# Patient Record
Sex: Male | Born: 1960 | Race: Black or African American | Hispanic: No | Marital: Single | Smoking: Never smoker
Health system: Southern US, Community
[De-identification: ages and names within clinical notes are randomized; demographics above are authoritative.]

## PROBLEM LIST (undated history)

## (undated) DIAGNOSIS — E119 Type 2 diabetes mellitus without complications: Secondary | ICD-10-CM

## (undated) DIAGNOSIS — B20 Human immunodeficiency virus [HIV] disease: Secondary | ICD-10-CM

## (undated) DIAGNOSIS — I10 Essential (primary) hypertension: Secondary | ICD-10-CM

---

## 1998-02-20 ENCOUNTER — Encounter: Payer: Self-pay | Admitting: Emergency Medicine

## 1998-02-20 ENCOUNTER — Emergency Department (HOSPITAL_COMMUNITY): Admission: EM | Admit: 1998-02-20 | Discharge: 1998-02-20 | Payer: Self-pay | Admitting: Emergency Medicine

## 1998-03-01 ENCOUNTER — Encounter: Admission: RE | Admit: 1998-03-01 | Discharge: 1998-05-30 | Payer: Self-pay | Admitting: Sports Medicine

## 1998-03-14 ENCOUNTER — Emergency Department (HOSPITAL_COMMUNITY): Admission: EM | Admit: 1998-03-14 | Discharge: 1998-03-14 | Payer: Self-pay | Admitting: Emergency Medicine

## 1998-03-21 ENCOUNTER — Encounter: Admission: RE | Admit: 1998-03-21 | Discharge: 1998-06-19 | Payer: Self-pay

## 2001-09-25 ENCOUNTER — Emergency Department (HOSPITAL_COMMUNITY): Admission: EM | Admit: 2001-09-25 | Discharge: 2001-09-25 | Payer: Self-pay | Admitting: Emergency Medicine

## 2005-06-19 ENCOUNTER — Ambulatory Visit: Payer: Self-pay | Admitting: Internal Medicine

## 2005-07-31 ENCOUNTER — Ambulatory Visit: Payer: Self-pay | Admitting: Internal Medicine

## 2005-08-28 ENCOUNTER — Ambulatory Visit: Payer: Self-pay | Admitting: Internal Medicine

## 2005-10-08 ENCOUNTER — Ambulatory Visit: Payer: Self-pay | Admitting: Internal Medicine

## 2006-01-10 ENCOUNTER — Ambulatory Visit: Payer: Self-pay | Admitting: Internal Medicine

## 2006-01-13 ENCOUNTER — Ambulatory Visit: Payer: Self-pay | Admitting: Internal Medicine

## 2006-01-20 ENCOUNTER — Ambulatory Visit: Payer: Self-pay | Admitting: Internal Medicine

## 2006-01-20 ENCOUNTER — Ambulatory Visit: Payer: Self-pay | Admitting: *Deleted

## 2006-01-28 ENCOUNTER — Ambulatory Visit: Payer: Self-pay | Admitting: Family Medicine

## 2006-02-14 ENCOUNTER — Ambulatory Visit (HOSPITAL_COMMUNITY): Admission: RE | Admit: 2006-02-14 | Discharge: 2006-02-14 | Payer: Self-pay | Admitting: Internal Medicine

## 2006-02-14 ENCOUNTER — Ambulatory Visit: Payer: Self-pay | Admitting: Internal Medicine

## 2007-06-04 ENCOUNTER — Ambulatory Visit: Payer: Self-pay | Admitting: Internal Medicine

## 2007-06-15 ENCOUNTER — Ambulatory Visit: Payer: Self-pay | Admitting: Internal Medicine

## 2007-06-15 LAB — CONVERTED CEMR LAB
ALT: 161 units/L — ABNORMAL HIGH (ref 0–53)
AST: 68 units/L — ABNORMAL HIGH (ref 0–37)
Absolute CD4: 207 #/uL — ABNORMAL LOW (ref 381–1469)
Albumin: 4.6 g/dL (ref 3.5–5.2)
Alkaline Phosphatase: 70 units/L (ref 39–117)
BUN: 17 mg/dL (ref 6–23)
Basophils Absolute: 0 10*3/uL (ref 0.0–0.1)
Basophils Relative: 1 % (ref 0–1)
CD4 T Helper %: 13 % — ABNORMAL LOW (ref 32–62)
CO2: 21 meq/L (ref 19–32)
Calcium: 9.4 mg/dL (ref 8.4–10.5)
Chloride: 103 meq/L (ref 96–112)
Creatinine, Ser: 1.04 mg/dL (ref 0.40–1.50)
Eosinophils Absolute: 0 10*3/uL (ref 0.0–0.7)
Eosinophils Relative: 1 % (ref 0–5)
Glucose, Bld: 94 mg/dL (ref 70–99)
HCT: 50.5 % (ref 39.0–52.0)
HIV 1 RNA Quant: <50 copies/mL (ref <50)
HIV-1 RNA Quant, Log: <1.7 (ref <1.70)
Hemoglobin: 16.9 g/dL (ref 13.0–17.0)
Lymphocytes Relative: 43 % (ref 12–46)
Lymphs Abs: 1.6 10*3/uL (ref 0.7–4.0)
MCHC: 33.5 g/dL (ref 30.0–36.0)
MCV: 88.6 fL (ref 78.0–100.0)
Monocytes Absolute: 0.3 10*3/uL (ref 0.1–1.0)
Monocytes Relative: 7 % (ref 3–12)
Neutro Abs: 1.8 10*3/uL (ref 1.7–7.7)
Neutrophils Relative %: 48 % (ref 43–77)
Platelets: 178 10*3/uL (ref 150–400)
Potassium: 4.2 meq/L (ref 3.5–5.3)
RBC: 5.7 M/uL (ref 4.22–5.81)
RDW: 13.9 % (ref 11.5–15.5)
Sodium: 137 meq/L (ref 135–145)
Total Bilirubin: 0.4 mg/dL (ref 0.3–1.2)
Total Lymphocytes %: 43 % (ref 12–46)
Total Protein: 8.3 g/dL (ref 6.0–8.3)
Total lymphocyte count: 1591 cells/mcL (ref 700–3300)
WBC, lymph enumeration: 3.7 10*3/uL — ABNORMAL LOW (ref 4.0–10.5)
WBC: 3.7 10*3/uL — ABNORMAL LOW (ref 4.0–10.5)

## 2007-07-02 ENCOUNTER — Ambulatory Visit: Payer: Self-pay | Admitting: Internal Medicine

## 2007-07-16 ENCOUNTER — Ambulatory Visit: Payer: Self-pay | Admitting: Internal Medicine

## 2007-07-21 ENCOUNTER — Ambulatory Visit: Payer: Self-pay | Admitting: Internal Medicine

## 2007-11-03 ENCOUNTER — Ambulatory Visit: Payer: Self-pay | Admitting: Internal Medicine

## 2007-11-03 LAB — CONVERTED CEMR LAB
ALT: 65 units/L — ABNORMAL HIGH (ref 0–53)
AST: 24 units/L (ref 0–37)
Absolute CD4: 262 #/uL — ABNORMAL LOW (ref 381–1469)
Albumin: 4.4 g/dL (ref 3.5–5.2)
Alkaline Phosphatase: 63 units/L (ref 39–117)
BUN: 21 mg/dL (ref 6–23)
Basophils Absolute: 0 10*3/uL (ref 0.0–0.1)
Basophils Relative: 1 % (ref 0–1)
CD4 T Helper %: 12 % — ABNORMAL LOW (ref 32–62)
CO2: 23 meq/L (ref 19–32)
Calcium: 9 mg/dL (ref 8.4–10.5)
Chlamydia, Swab/Urine, PCR: NEGATIVE
Chloride: 104 meq/L (ref 96–112)
Cholesterol: 254 mg/dL — ABNORMAL HIGH (ref 0–200)
Creatinine, Ser: 1.2 mg/dL (ref 0.40–1.50)
Eosinophils Absolute: 0.1 10*3/uL (ref 0.0–0.7)
Eosinophils Relative: 2 % (ref 0–5)
GC Probe Amp, Urine: NEGATIVE
Glucose, Bld: 94 mg/dL (ref 70–99)
HCT: 47.3 % (ref 39.0–52.0)
HDL: 42 mg/dL (ref >39)
HIV 1 RNA Quant: <50 copies/mL (ref <50)
HIV-1 RNA Quant, Log: <1.7 (ref <1.70)
Hemoglobin: 15.9 g/dL (ref 13.0–17.0)
Lymphocytes Relative: 42 % (ref 12–46)
Lymphs Abs: 2.2 10*3/uL (ref 0.7–4.0)
MCHC: 33.6 g/dL (ref 30.0–36.0)
MCV: 89.4 fL (ref 78.0–100.0)
Monocytes Absolute: 0.5 10*3/uL (ref 0.1–1.0)
Monocytes Relative: 9 % (ref 3–12)
Neutro Abs: 2.4 10*3/uL (ref 1.7–7.7)
Neutrophils Relative %: 46 % (ref 43–77)
Platelets: 129 10*3/uL — ABNORMAL LOW (ref 150–400)
Potassium: 4.1 meq/L (ref 3.5–5.3)
RBC: 5.29 M/uL (ref 4.22–5.81)
RDW: 14.3 % (ref 11.5–15.5)
Sodium: 141 meq/L (ref 135–145)
Total Bilirubin: 0.3 mg/dL (ref 0.3–1.2)
Total CHOL/HDL Ratio: 6
Total Lymphocytes %: 42 % (ref 12–46)
Total Protein: 7.7 g/dL (ref 6.0–8.3)
Total lymphocyte count: 2184 cells/mcL (ref 700–3300)
Triglycerides: 534 mg/dL — ABNORMAL HIGH (ref <150)
WBC, lymph enumeration: 5.2 10*3/uL (ref 4.0–10.5)
WBC: 5.2 10*3/uL (ref 4.0–10.5)

## 2008-02-03 ENCOUNTER — Ambulatory Visit: Payer: Self-pay | Admitting: Internal Medicine

## 2008-02-03 LAB — CONVERTED CEMR LAB
Chlamydia, Swab/Urine, PCR: NEGATIVE
Cholesterol: 202 mg/dL — ABNORMAL HIGH (ref 0–200)
Direct LDL: 119 mg/dL — ABNORMAL HIGH
GC Probe Amp, Urine: NEGATIVE
HCV Ab: NEGATIVE
HDL: 43 mg/dL (ref >39)
Hep A Total Ab: POSITIVE — AB
Hep B Core Total Ab: NEGATIVE
Hep B E Ab: NEGATIVE
Hep B S Ab: NEGATIVE
Hepatitis B Surface Ag: NEGATIVE
LDL Cholesterol: 120 mg/dL — ABNORMAL HIGH (ref 0–99)
Total CHOL/HDL Ratio: 4.7
Triglycerides: 194 mg/dL — ABNORMAL HIGH (ref <150)
VLDL: 39 mg/dL (ref 0–40)

## 2013-08-02 ENCOUNTER — Ambulatory Visit (INDEPENDENT_AMBULATORY_CARE_PROVIDER_SITE_OTHER): Payer: No Typology Code available for payment source | Admitting: Emergency Medicine

## 2013-08-02 VITALS — BP 130/96 | HR 97 | Temp 100.4°F | Resp 20 | Ht 71.5 in | Wt 218.8 lb

## 2013-08-02 DIAGNOSIS — J111 Influenza due to unidentified influenza virus with other respiratory manifestations: Secondary | ICD-10-CM

## 2013-08-02 MED ORDER — PSEUDOEPHEDRINE-GUAIFENESIN ER 60-600 MG PO TB12: 1.0000 | ORAL_TABLET | Freq: Two times a day (BID) | ORAL | Status: AC

## 2013-08-02 MED ORDER — OSELTAMIVIR PHOSPHATE 75 MG PO CAPS: 75.0000 mg | ORAL_CAPSULE | Freq: Two times a day (BID) | ORAL | Status: AC

## 2013-08-02 MED ORDER — PROMETHAZINE-CODEINE 6.25-10 MG/5ML PO SYRP: 5.0000 mL | ORAL_SOLUTION | Freq: Four times a day (QID) | ORAL | Status: AC | PRN

## 2013-08-02 NOTE — Patient Instructions (Signed)
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
## 2020-01-17 ENCOUNTER — Institutional Professional Consult (permissible substitution): Payer: Self-pay | Admitting: Pulmonary Disease

## 2020-01-18 ENCOUNTER — Emergency Department (HOSPITAL_COMMUNITY)
Admission: EM | Admit: 2020-01-18 | Discharge: 2020-01-18 | Disposition: A | Discharge status: Home / Self Care | Payer: BLUE CROSS/BLUE SHIELD | Source: Home / Self Care | Attending: Emergency Medicine | Admitting: Emergency Medicine

## 2020-01-18 ENCOUNTER — Other Ambulatory Visit: Payer: Self-pay

## 2020-01-18 ENCOUNTER — Emergency Department (HOSPITAL_COMMUNITY)
Admission: EM | Admit: 2020-01-18 | Discharge: 2020-01-18 | Disposition: A | Discharge status: Home / Self Care | Payer: BLUE CROSS/BLUE SHIELD | Attending: Emergency Medicine | Admitting: Emergency Medicine

## 2020-01-18 ENCOUNTER — Encounter (HOSPITAL_COMMUNITY): Payer: Self-pay

## 2020-01-18 ENCOUNTER — Emergency Department (HOSPITAL_COMMUNITY): Payer: BLUE CROSS/BLUE SHIELD

## 2020-01-18 DIAGNOSIS — I10 Essential (primary) hypertension: Secondary | ICD-10-CM | POA: Insufficient documentation

## 2020-01-18 DIAGNOSIS — U071 COVID-19: Secondary | ICD-10-CM

## 2020-01-18 DIAGNOSIS — R55 Syncope and collapse: Secondary | ICD-10-CM | POA: Insufficient documentation

## 2020-01-18 DIAGNOSIS — D849 Immunodeficiency, unspecified: Secondary | ICD-10-CM | POA: Diagnosis not present

## 2020-01-18 DIAGNOSIS — R05 Cough: Secondary | ICD-10-CM | POA: Diagnosis present

## 2020-01-18 DIAGNOSIS — Z7982 Long term (current) use of aspirin: Secondary | ICD-10-CM | POA: Insufficient documentation

## 2020-01-18 DIAGNOSIS — E119 Type 2 diabetes mellitus without complications: Secondary | ICD-10-CM | POA: Diagnosis not present

## 2020-01-18 DIAGNOSIS — Z79899 Other long term (current) drug therapy: Secondary | ICD-10-CM | POA: Diagnosis not present

## 2020-01-18 HISTORY — DX: Type 2 diabetes mellitus without complications: E11.9

## 2020-01-18 HISTORY — DX: Human immunodeficiency virus (HIV) disease: B20

## 2020-01-18 HISTORY — DX: Essential (primary) hypertension: I10

## 2020-01-18 LAB — COMPREHENSIVE METABOLIC PANEL
ALT: 54 U/L — ABNORMAL HIGH (ref 0–44)
AST: 41 U/L (ref 15–41)
Albumin: 3.3 g/dL — ABNORMAL LOW (ref 3.5–5.0)
Alkaline Phosphatase: 43 U/L (ref 38–126)
Anion gap: 10 (ref 5–15)
BUN: 14 mg/dL (ref 6–20)
CO2: 20 mmol/L — ABNORMAL LOW (ref 22–32)
Calcium: 7.8 mg/dL — ABNORMAL LOW (ref 8.9–10.3)
Chloride: 110 mmol/L (ref 98–111)
Creatinine, Ser: 0.86 mg/dL (ref 0.61–1.24)
GFR calc Af Amer: >60 mL/min (ref >60)
GFR calc non Af Amer: >60 mL/min (ref >60)
Glucose, Bld: 89 mg/dL (ref 70–99)
Potassium: 4.5 mmol/L (ref 3.5–5.1)
Sodium: 140 mmol/L (ref 135–145)
Total Bilirubin: 0.9 mg/dL (ref 0.3–1.2)
Total Protein: 6.7 g/dL (ref 6.5–8.1)

## 2020-01-18 LAB — C-REACTIVE PROTEIN: CRP: 4.9 mg/dL — ABNORMAL HIGH (ref <1.0)

## 2020-01-18 LAB — CBC WITH DIFFERENTIAL/PLATELET
Abs Immature Granulocytes: 0.04 10*3/uL (ref 0.00–0.07)
Basophils Absolute: 0 10*3/uL (ref 0.0–0.1)
Basophils Relative: 0 %
Eosinophils Absolute: 0.1 10*3/uL (ref 0.0–0.5)
Eosinophils Relative: 1 %
HCT: 42.6 % (ref 39.0–52.0)
Hemoglobin: 13.9 g/dL (ref 13.0–17.0)
Immature Granulocytes: 1 %
Lymphocytes Relative: 16 %
Lymphs Abs: 0.7 10*3/uL (ref 0.7–4.0)
MCH: 28.7 pg (ref 26.0–34.0)
MCHC: 32.6 g/dL (ref 30.0–36.0)
MCV: 88 fL (ref 80.0–100.0)
Monocytes Absolute: 0.6 10*3/uL (ref 0.1–1.0)
Monocytes Relative: 14 %
Neutro Abs: 2.9 10*3/uL (ref 1.7–7.7)
Neutrophils Relative %: 68 %
Platelets: 217 10*3/uL (ref 150–400)
RBC: 4.84 MIL/uL (ref 4.22–5.81)
RDW: 13.1 % (ref 11.5–15.5)
WBC: 4.3 10*3/uL (ref 4.0–10.5)
nRBC: 0 % (ref 0.0–0.2)

## 2020-01-18 LAB — PROCALCITONIN: Procalcitonin: <0.1 ng/mL

## 2020-01-18 LAB — D-DIMER, QUANTITATIVE: D-Dimer, Quant: 3.62 ug/mL-FEU — ABNORMAL HIGH (ref 0.00–0.50)

## 2020-01-18 LAB — FERRITIN: Ferritin: 531 ng/mL — ABNORMAL HIGH (ref 24–336)

## 2020-01-18 LAB — LACTATE DEHYDROGENASE: LDH: 295 U/L — ABNORMAL HIGH (ref 98–192)

## 2020-01-18 LAB — FIBRINOGEN: Fibrinogen: 479 mg/dL — ABNORMAL HIGH (ref 210–475)

## 2020-01-18 LAB — LACTIC ACID, PLASMA: Lactic Acid, Venous: 1.4 mmol/L (ref 0.5–1.9)

## 2020-01-18 LAB — TRIGLYCERIDES: Triglycerides: 194 mg/dL — ABNORMAL HIGH (ref <150)

## 2020-01-18 MED ORDER — SODIUM CHLORIDE 0.9 % IV SOLN: INTRAVENOUS | Status: DC | PRN

## 2020-01-18 MED ORDER — ASPIRIN EC 81 MG PO TBEC: 81.0000 mg | DELAYED_RELEASE_TABLET | Freq: Every day | ORAL | 0 refills | Status: AC

## 2020-01-18 MED ORDER — ALBUTEROL SULFATE HFA 108 (90 BASE) MCG/ACT IN AERS
2.0000 | INHALATION_SPRAY | Freq: Once | RESPIRATORY_TRACT | Status: DC | PRN
  Filled 2020-01-18: qty 6.7

## 2020-01-18 MED ORDER — ALBUTEROL SULFATE HFA 108 (90 BASE) MCG/ACT IN AERS
4.0000 | INHALATION_SPRAY | RESPIRATORY_TRACT | Status: DC | PRN
  Administered 2020-01-18: 4 via RESPIRATORY_TRACT

## 2020-01-18 MED ORDER — BENZONATATE 100 MG PO CAPS: 100.0000 mg | ORAL_CAPSULE | Freq: Three times a day (TID) | ORAL | 0 refills | Status: AC

## 2020-01-18 MED ORDER — EPINEPHRINE 0.3 MG/0.3ML IJ SOAJ: 0.3000 mg | Freq: Once | INTRAMUSCULAR | Status: DC | PRN

## 2020-01-18 MED ORDER — DM-GUAIFENESIN ER 30-600 MG PO TB12: 1.0000 | ORAL_TABLET | Freq: Two times a day (BID) | ORAL | 0 refills | Status: DC

## 2020-01-18 MED ORDER — SODIUM CHLORIDE 0.9 % IV SOLN
1200.0000 mg | Freq: Once | INTRAVENOUS | Status: AC
  Administered 2020-01-18: 1200 mg via INTRAVENOUS
  Filled 2020-01-18: qty 1200

## 2020-01-18 MED ORDER — DM-GUAIFENESIN ER 30-600 MG PO TB12: 1.0000 | ORAL_TABLET | Freq: Two times a day (BID) | ORAL | Status: DC

## 2020-01-18 MED ORDER — DIPHENHYDRAMINE HCL 50 MG/ML IJ SOLN: 50.0000 mg | Freq: Once | INTRAMUSCULAR | Status: DC | PRN

## 2020-01-18 MED ORDER — METHYLPREDNISOLONE SODIUM SUCC 125 MG IJ SOLR
125.0000 mg | Freq: Once | INTRAMUSCULAR | Status: AC | PRN
  Administered 2020-01-18: 125 mg via INTRAVENOUS
  Filled 2020-01-18: qty 2

## 2020-01-18 MED ORDER — FAMOTIDINE IN NACL 20-0.9 MG/50ML-% IV SOLN
20.0000 mg | Freq: Once | INTRAVENOUS | Status: AC | PRN
  Administered 2020-01-18: 20 mg via INTRAVENOUS
  Filled 2020-01-18: qty 50

## 2020-01-18 NOTE — Discharge Instructions (Addendum)
Please take the inhaler treatment every 2-4 hours as needed. Cough medications also prescribed.  Return to the ER if your symptoms get worse. Follow-up with your primary care doctor in 7 to 14 days.

## 2020-01-18 NOTE — ED Triage Notes (Signed)
Pt reports being sent here by his PCP for an infusion but does not know what kind of infusion. Sts he is covid + 8/14

## 2020-01-18 NOTE — ED Provider Notes (Signed)
[facility]-EMERGENCY DEPT Provider Note   CSN: [id number] Arrival date & time: 01/18/20  1607     History Chief Complaint  Patient presents with  . Near Syncope    Nathaniel Myers is a 59 y.o. male.  HPI    59 year old male comes in a chief complaint of cough and near fainting spell. He has history of diabetes, HIV, hypertension and was diagnosed with COVID-19 on 8/14. According to the clinicians note, it appears that patient was symptomatic for 4 to 5 days before the diagnosis. He was advised by his infectious disease doctor to get monoclonal antibodies. At the infusion center patient had a coughing spell followed by near syncope, and he was sent to the ER for further assessment.  Patient reports that he gets short of breath with any exertion.  He denies fainting spell.  He has no nausea, vomiting, fevers, chills.  Mostly patient is having cough, nonproductive.   Past Medical History:  Diagnosis Date  . Diabetes mellitus without complication (HCC)   . HIV (human immunodeficiency virus infection) (HCC)   . Hypertension     There are no problems to display for this patient.   No past surgical history on file.     Family History  Problem Relation Age of Onset  . Kidney disease Mother     Social History   Tobacco Use  . Smoking status: Never Smoker  . Smokeless tobacco: Never Used  Substance Use Topics  . Alcohol use: No  . Drug use: No    Home Medications Prior to Admission medications   Medication Sig Start Date End Date Taking? Authorizing Provider  aspirin EC 81 MG tablet Take 1 tablet (81 mg total) by mouth daily. Swallow whole. 01/18/20   Derwood Kaplan, MD  benzonatate (TESSALON) 100 MG capsule Take 1 capsule (100 mg total) by mouth every 8 (eight) hours. 01/18/20   Derwood Kaplan, MD  dextromethorphan-guaiFENesin (MUCINEX DM) 30-600 MG 12hr tablet Take 1 tablet by mouth 2 (two) times daily. 01/18/20   Derwood Kaplan, MD    oseltamivir (TAMIFLU) 75 MG capsule Take 1 capsule (75 mg total) by mouth 2 (two) times daily. 08/02/13   Carmelina Dane, MD  promethazine-codeine [facility] WITH CODEINE) 6.25-10 MG/5ML syrup Take 5-10 mLs by mouth every 6 (six) hours as needed. 08/02/13   Carmelina Dane, MD    Allergies    Patient has no known allergies.  Review of Systems   Review of Systems  Constitutional: Positive for activity change. Negative for chills, diaphoresis and fever.  Respiratory: Positive for cough and shortness of breath.   Cardiovascular: Negative for chest pain.  Gastrointestinal: Negative for nausea and vomiting.  Allergic/Immunologic: Positive for immunocompromised state.  All other systems reviewed and are negative.   Physical Exam Updated Vital Signs BP 123/81 (BP Location: Right Arm)   Pulse 95   Temp 98.3 F (36.8 C) (Oral)   Resp 15   Ht 6\' 1"  (1.854 m)   Wt 95.3 kg   SpO2 100%   BMI 27.71 kg/m   Physical Exam Vitals and nursing note reviewed.  Constitutional:      Appearance: He is well-developed.  HENT:     Head: Atraumatic.  Cardiovascular:     Rate and Rhythm: Normal rate.  Pulmonary:     Effort: Pulmonary effort is normal. No respiratory distress.     Breath sounds: No wheezing.     Comments: Tachypnea Musculoskeletal:     Cervical  back: Neck supple.     Right lower leg: No edema.     Left lower leg: No edema.  Skin:    General: Skin is warm.  Neurological:     Mental Status: He is alert and oriented to person, place, and time.     ED Results / Procedures / Treatments   Labs (all labs ordered are listed, but only abnormal results are displayed) Labs Reviewed  COMPREHENSIVE METABOLIC PANEL - Abnormal; Notable for the following components:      Result Value   CO2 20 (*)    Calcium 7.8 (*)    Albumin 3.3 (*)    ALT 54 (*)    All other components within normal limits  D-DIMER, QUANTITATIVE (NOT AT [facility]) - Abnormal; Notable for the following  components:   D-Dimer, Quant 3.62 (*)    All other components within normal limits  LACTATE DEHYDROGENASE - Abnormal; Notable for the following components:   LDH 295 (*)    All other components within normal limits  FERRITIN - Abnormal; Notable for the following components:   Ferritin 531 (*)    All other components within normal limits  TRIGLYCERIDES - Abnormal; Notable for the following components:   Triglycerides 194 (*)    All other components within normal limits  FIBRINOGEN - Abnormal; Notable for the following components:   Fibrinogen 479 (*)    All other components within normal limits  C-REACTIVE PROTEIN - Abnormal; Notable for the following components:   CRP 4.9 (*)    All other components within normal limits  LACTIC ACID, PLASMA  CBC WITH DIFFERENTIAL/PLATELET  PROCALCITONIN  LACTIC ACID, PLASMA    EKG EKG Interpretation  Date/Time:  Tuesday January 18 2020 16:25:44 EDT Ventricular Rate:  95 PR Interval:    QRS Duration: 91 QT Interval:  343 QTC Calculation: 432 R Axis:   14 Text Interpretation: Sinus rhythm Abnormal R-wave progression, early transition No acute changes No old tracing to compare Confirmed by Derwood Kaplan [phone]) on 01/18/2020 6:01:21 PM   Radiology DG Chest Port 1 View  Result Date: 01/18/2020 CLINICAL DATA:  COVID near syncope EXAM: PORTABLE CHEST 1 VIEW COMPARISON:  02/14/2006 FINDINGS: Vague peripheral and left basilar opacity. No pleural effusion or pneumothorax. Mild cardiomegaly. IMPRESSION: Vague peripheral and left basilar opacity, suspicious for bilateral pneumonia and given clinical history. Borderline to mild cardiomegaly Electronically Signed   By: Jasmine Pang M.D.   On: 01/18/2020 18:22    Procedures Procedures (including critical care time)  Medications Ordered in ED Medications  0.9 %  sodium chloride infusion (has no administration in time range)  diphenhydrAMINE (BENADRYL) injection 50 mg (has no administration in time  range)  famotidine (PEPCID) IVPB 20 mg premix (has no administration in time range)  methylPREDNISolone sodium succinate (SOLU-MEDROL) 125 mg/2 mL injection 125 mg (has no administration in time range)  albuterol (VENTOLIN HFA) 108 (90 Base) MCG/ACT inhaler 2 puff (has no administration in time range)  EPINEPHrine (EPI-PEN) injection 0.3 mg (has no administration in time range)  albuterol (VENTOLIN HFA) 108 (90 Base) MCG/ACT inhaler 4 puff (has no administration in time range)  dextromethorphan-guaiFENesin (MUCINEX DM) 30-600 MG per 12 hr tablet 1 tablet (has no administration in time range)  casirivimab-imdevimab (REGEN-COV) 1,200 mg in sodium chloride 0.9 % 110 mL IVPB (0 mg Intravenous Stopped 01/18/20 1738)    ED Course  I have reviewed the triage vital signs and the nursing notes.  Pertinent labs & imaging results that  were available during my care of the patient were reviewed by me and considered in my medical decision making (see chart for details).    MDM Rules/Calculators/A&P                          Nathaniel Myers was evaluated in Emergency Department on 01/18/2020 for the symptoms described in the history of present illness. He was evaluated in the context of the global COVID-19 pandemic, which necessitated consideration that the patient might be at risk for infection with the SARS-CoV-2 virus that causes COVID-19. Institutional protocols and algorithms that pertain to the evaluation of patients at risk for COVID-19 are in a state of rapid change based on information released by regulatory bodies including the CDC and federal and state organizations. These policies and algorithms were followed during the patient's care in the ED.  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  He is noted to have COVID-19 diagnosis since August 14.  It appears that he had been symptomatic for few days before the diagnosis.  His doctor had  advised him to get monoclonal antibodies.  He is outside of the 10-day window, I discussed the case with ID doctor and they said that his doctor might have suggested the dose given his immunosuppression, off label use.  There is not a lot of complications with MAB infusion.  Patient is willing to get infusion in the ER, therefore it has been ordered, with the understanding that it may or may not help.  Patient had some dizziness and near fainting spell while awaiting infusion.  He states that he was having a coughing bout.  Likely this is a neurocardiogenic event and not a cardiogenic or PE type event.  EKG is reassuring.  Patient is not noted to be hypoxic or profoundly tachycardic, or in respiratory distress while in the ER.  Reassessment at 705: Patient has received the infusion.  He stable for discharge.  Wife has been contacted and made aware of the plan. Patient had agreed for hospital at home admission.  Given his persistent shortness of breath, I think it might be a good idea to enroll him in the hospital at home program to ensure he is recovering well. Patient and Dr. Frances Furbish made aware.   Final Clinical Impression(s) / ED Diagnoses Final diagnoses:  COVID-19    Rx / DC Orders ED Discharge Orders         Ordered    dextromethorphan-guaiFENesin [facility] DM) 30-600 MG 12hr tablet  2 times daily        01/18/20 1906    benzonatate (TESSALON) 100 MG capsule  Every 8 hours        01/18/20 1906    aspirin EC 81 MG tablet  Daily        01/18/20 1906           Derwood Kaplan, MD 01/18/20 1909

## 2020-01-18 NOTE — ED Notes (Signed)
informed wife of patient's plan of care-patient's wife would like updates when able-Ms Mance [phone]

## 2020-01-18 NOTE — ED Triage Notes (Signed)
Pt was at infusion center for covid pt when he has a near syncope episode. Pt is A&O x4 at this time, VSS and no acute distress noted.

## 2020-03-16 ENCOUNTER — Other Ambulatory Visit: Payer: Self-pay

## 2020-03-16 ENCOUNTER — Encounter (HOSPITAL_COMMUNITY): Payer: Self-pay

## 2020-03-16 ENCOUNTER — Emergency Department (HOSPITAL_COMMUNITY)
Admission: EM | Admit: 2020-03-16 | Discharge: 2020-03-16 | Disposition: A | Discharge status: Home / Self Care | Payer: BLUE CROSS/BLUE SHIELD | Attending: Emergency Medicine | Admitting: Emergency Medicine

## 2020-03-16 ENCOUNTER — Emergency Department (HOSPITAL_COMMUNITY): Payer: BLUE CROSS/BLUE SHIELD

## 2020-03-16 DIAGNOSIS — E119 Type 2 diabetes mellitus without complications: Secondary | ICD-10-CM | POA: Diagnosis not present

## 2020-03-16 DIAGNOSIS — I10 Essential (primary) hypertension: Secondary | ICD-10-CM | POA: Diagnosis not present

## 2020-03-16 DIAGNOSIS — Z7982 Long term (current) use of aspirin: Secondary | ICD-10-CM | POA: Insufficient documentation

## 2020-03-16 DIAGNOSIS — M542 Cervicalgia: Secondary | ICD-10-CM | POA: Diagnosis not present

## 2020-03-16 DIAGNOSIS — Z21 Asymptomatic human immunodeficiency virus [HIV] infection status: Secondary | ICD-10-CM | POA: Diagnosis not present

## 2020-03-16 MED ORDER — IBUPROFEN 800 MG PO TABS: 800.0000 mg | ORAL_TABLET | Freq: Three times a day (TID) | ORAL | 0 refills | Status: AC

## 2020-03-16 MED ORDER — ACETAMINOPHEN 325 MG PO TABS
650.0000 mg | ORAL_TABLET | Freq: Once | ORAL | Status: AC
  Administered 2020-03-16: 650 mg via ORAL
  Filled 2020-03-16: qty 2

## 2020-03-16 MED ORDER — CYCLOBENZAPRINE HCL 10 MG PO TABS: 10.0000 mg | ORAL_TABLET | Freq: Two times a day (BID) | ORAL | 0 refills | Status: AC | PRN

## 2020-03-16 NOTE — Discharge Instructions (Signed)
Recommend Tylenol and Motrin for pain.  Recommend muscle relaxant as well.  Please return to the ED if symptoms worsen to follow-up with your primary care doctor as well.
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
## 2022-05-09 ENCOUNTER — Emergency Department (HOSPITAL_COMMUNITY)
Admission: EM | Admit: 2022-05-09 | Discharge: 2022-05-09 | Disposition: A | Discharge status: Home / Self Care | Payer: BLUE CROSS/BLUE SHIELD | Attending: Emergency Medicine | Admitting: Emergency Medicine

## 2022-05-09 ENCOUNTER — Other Ambulatory Visit: Payer: Self-pay

## 2022-05-09 ENCOUNTER — Emergency Department (HOSPITAL_COMMUNITY): Payer: BLUE CROSS/BLUE SHIELD

## 2022-05-09 ENCOUNTER — Encounter (HOSPITAL_COMMUNITY): Payer: Self-pay

## 2022-05-09 DIAGNOSIS — R0981 Nasal congestion: Secondary | ICD-10-CM | POA: Diagnosis not present

## 2022-05-09 DIAGNOSIS — R059 Cough, unspecified: Secondary | ICD-10-CM | POA: Diagnosis present

## 2022-05-09 DIAGNOSIS — Z7982 Long term (current) use of aspirin: Secondary | ICD-10-CM | POA: Diagnosis not present

## 2022-05-09 MED ORDER — DM-GUAIFENESIN ER 30-600 MG PO TB12: 1.0000 | ORAL_TABLET | Freq: Two times a day (BID) | ORAL | 0 refills | Status: AC

## 2022-05-09 MED ORDER — OMEPRAZOLE 20 MG PO CPDR: 20.0000 mg | DELAYED_RELEASE_CAPSULE | Freq: Every day | ORAL | 0 refills | Status: AC

## 2022-05-09 MED ORDER — ALUM & MAG HYDROXIDE-SIMETH 200-200-20 MG/5ML PO SUSP
30.0000 mL | Freq: Once | ORAL | Status: AC
  Administered 2022-05-09: 30 mL via ORAL
  Filled 2022-05-09: qty 30

## 2022-05-09 NOTE — ED Provider Notes (Signed)
[place] DEPT Provider Note   CSN: [id number] Arrival date & time: 05/09/22  0209     History  Chief Complaint  Patient presents with   Cough    Nathaniel Myers is a 61 y.o. male.  The history is provided by the patient.  Cough Cough characteristics:  Non-productive Severity:  Moderate Onset quality:  Gradual Duration:  1 week Timing:  Constant Progression:  Unchanged Relieved by:  Nothing Worsened by:  Nothing Ineffective treatments: tessalon and albuterol and nasal spray. Associated symptoms: no chest pain, no fever, no shortness of breath, no sore throat and no wheezing   Risk factors: no chemical exposure   Patient has been treated for cough and congestion since 04/30/22.  He has been on several medications without relief.  Symptoms are worst at night lying flat.  Patient reports his covid and flu tests were negative at his doctor's office.       Home Medications Prior to Admission medications   Medication Sig Start Date End Date Taking? Authorizing Provider  omeprazole (PRILOSEC) 20 MG capsule Take 1 capsule (20 mg total) by mouth daily. 05/09/22  Yes Meghanne Pletz, MD  aspirin EC 81 MG tablet Take 1 tablet (81 mg total) by mouth daily. Swallow whole. 01/18/20   Varney Biles, MD  benzonatate (TESSALON) 100 MG capsule Take 1 capsule (100 mg total) by mouth every 8 (eight) hours. 01/18/20   Varney Biles, MD  cyclobenzaprine (FLEXERIL) 10 MG tablet Take 1 tablet (10 mg total) by mouth 2 (two) times daily as needed for up to 20 doses for muscle spasms. 03/16/20   Curatolo, Adam, DO  dextromethorphan-guaiFENesin (MUCINEX DM) 30-600 MG 12hr tablet Take 1 tablet by mouth 2 (two) times daily. 05/09/22   Rhyker Silversmith, MD  oseltamivir (TAMIFLU) 75 MG capsule Take 1 capsule (75 mg total) by mouth 2 (two) times daily. 08/02/13   Roselee Culver, MD  promethazine-codeine [facility] WITH CODEINE) 6.25-10 MG/5ML syrup Take 5-10 mLs by mouth  every 6 (six) hours as needed. 08/02/13   Roselee Culver, MD      Allergies    Patient has no known allergies.    Review of Systems   Review of Systems  Constitutional:  Negative for fever.  HENT:  Negative for facial swelling, sore throat and voice change.   Respiratory:  Positive for cough. Negative for shortness of breath and wheezing.   Cardiovascular:  Negative for chest pain.  Gastrointestinal:  Negative for vomiting.  All other systems reviewed and are negative.   Physical Exam Updated Vital Signs BP (!) 154/94 (BP Location: Left Arm)   Pulse 100   Temp 97.9 F (36.6 C) (Oral)   Resp (!) 22   Ht 6\' 1"  (1.854 m)   Wt 93.9 kg   SpO2 100%   BMI 27.31 kg/m  Physical Exam Vitals and nursing note reviewed. Exam conducted with a chaperone present.  Constitutional:      General: He is not in acute distress.    Appearance: He is well-developed. He is not diaphoretic.  HENT:     Head: Normocephalic and atraumatic.  Eyes:     Conjunctiva/sclera: Conjunctivae normal.     Pupils: Pupils are equal, round, and reactive to light.  Neck:     Comments: No trismus intact phonation, no pain with displacement of the larynx  Cardiovascular:     Rate and Rhythm: Normal rate and regular rhythm.     Pulses: Normal pulses.  Heart sounds: Normal heart sounds.  Pulmonary:     Effort: Pulmonary effort is normal. No respiratory distress.     Breath sounds: Normal breath sounds. No stridor. No wheezing, rhonchi or rales.  Chest:     Chest wall: No tenderness.  Abdominal:     General: Bowel sounds are normal.     Palpations: Abdomen is soft.     Tenderness: There is no abdominal tenderness. There is no guarding or rebound.  Musculoskeletal:        General: Normal range of motion.     Cervical back: Normal range of motion and neck supple.  Skin:    General: Skin is warm and dry.     Capillary Refill: Capillary refill takes less than 2 seconds.  Neurological:     General: No  focal deficit present.     Mental Status: He is alert and oriented to person, place, and time.     Deep Tendon Reflexes: Reflexes normal.  Psychiatric:        Mood and Affect: Mood normal.        Behavior: Behavior normal.     ED Results / Procedures / Treatments   Labs (all labs ordered are listed, but only abnormal results are displayed) Labs Reviewed - No data to display  EKG None  Radiology DG Chest Portable 1 View  Result Date: 05/09/2022 CLINICAL DATA:  Cough and chest tightness for 1 week EXAM: PORTABLE CHEST 1 VIEW COMPARISON:  01/18/2020 FINDINGS: The heart size and mediastinal contours are within normal limits. Both lungs are clear. The visualized skeletal structures are unremarkable. IMPRESSION: No active disease. Electronically Signed   By: Inez Catalina M.D.   On: 05/09/2022 02:48    Procedures Procedures    Medications Ordered in ED Medications  alum & mag hydroxide-simeth (MAALOX/MYLANTA) 200-200-20 MG/5ML suspension 30 mL (30 mLs Oral Given 05/09/22 0247)    ED Course/ Medical Decision Making/ A&P                           Medical Decision Making Patient with congestion and cough who has not been free of cough since 12/5.  Worst at night lying flat   Amount and/or Complexity of Data Reviewed Independent Historian: spouse    Details: See above  External Data Reviewed: labs and notes.    Details: Previous notes and labs in care everywhere reviewed  Radiology: ordered and independent interpretation performed.    Details: Clear CXR  Risk OTC drugs. Prescription drug management. Risk Details: Given that symptoms are worst at night lying flate I suspect there is a component of GERD with likely laryngeal reflux causing cough.  Will start a PPI.  Will also add mucinex.  No indication for antibiotics as no signs of a bacterial infection.  Stable for discharge.  Strict return    Final Clinical Impression(s) / ED Diagnoses Final diagnoses:  Cough,  unspecified type   Return for intractable cough, coughing up blood, fevers > 100.4 unrelieved by medication, shortness of breath, intractable vomiting, chest pain, shortness of breath, weakness, numbness, changes in speech, facial asymmetry, abdominal pain, passing out, Inability to tolerate liquids or food, cough, altered mental status or any concerns. No signs of systemic illness or infection. The patient is nontoxic-appearing on exam and vital signs are within normal limits.  I have reviewed the triage vital signs and the nursing notes. Pertinent labs & imaging results that were available during my care  of the patient were reviewed by me and considered in my medical decision making (see chart for details). After history, exam, and medical workup I feel the patient has been appropriately medically screened and is safe for discharge home. Pertinent diagnoses were discussed with the patient. Patient was given return precautions.  Rx / DC Orders ED Discharge Orders          Ordered    omeprazole (PRILOSEC) 20 MG capsule  Daily        05/09/22 0302    dextromethorphan-guaiFENesin (MUCINEX DM) 30-600 MG 12hr tablet  2 times daily        05/09/22 0307              Adonus Uselman, MD 05/09/22 3299

## 2022-05-09 NOTE — ED Triage Notes (Signed)
Cough and chest congestion ~1 week. Went to PCP and has been using albuterol inhaler with no improvement.   Labored and difficulty catching breath.
